# Patient Record
Sex: Male | Born: 1960 | Race: White | Hispanic: No | Marital: Married | Smoking: Never smoker
Health system: Southern US, Community
[De-identification: ages and names within clinical notes are randomized; demographics above are authoritative.]

---

## 2009-05-16 ENCOUNTER — Emergency Department (HOSPITAL_COMMUNITY): Admission: EM | Admit: 2009-05-16 | Discharge: 2009-05-16 | Payer: Self-pay | Admitting: Emergency Medicine

## 2011-03-06 ENCOUNTER — Emergency Department (HOSPITAL_COMMUNITY): Payer: PRIVATE HEALTH INSURANCE

## 2011-03-06 ENCOUNTER — Emergency Department (HOSPITAL_COMMUNITY)
Admission: EM | Admit: 2011-03-06 | Discharge: 2011-03-06 | Disposition: A | Discharge status: Home / Self Care | Payer: PRIVATE HEALTH INSURANCE | Attending: Emergency Medicine | Admitting: Emergency Medicine

## 2011-03-06 DIAGNOSIS — N200 Calculus of kidney: Secondary | ICD-10-CM | POA: Insufficient documentation

## 2011-03-06 DIAGNOSIS — R109 Unspecified abdominal pain: Secondary | ICD-10-CM | POA: Insufficient documentation

## 2011-03-06 LAB — URINE MICROSCOPIC-ADD ON

## 2011-03-06 LAB — URINALYSIS, ROUTINE W REFLEX MICROSCOPIC
Glucose, UA: NEGATIVE mg/dL
Ketones, ur: 15 mg/dL — AB
pH: 5.5 (ref 5.0–8.0)

## 2011-03-17 LAB — CULTURE, BLOOD (ROUTINE X 2)

## 2011-03-17 LAB — COMPREHENSIVE METABOLIC PANEL
Alkaline Phosphatase: 46 U/L (ref 39–117)
BUN: 13 mg/dL (ref 6–23)
Chloride: 107 mEq/L (ref 96–112)
Glucose, Bld: 125 mg/dL — ABNORMAL HIGH (ref 70–99)
Potassium: 3.7 mEq/L (ref 3.5–5.1)
Total Bilirubin: 1.1 mg/dL (ref 0.3–1.2)

## 2011-03-17 LAB — CBC
HCT: 39.8 % (ref 39.0–52.0)
Hemoglobin: 13.8 g/dL (ref 13.0–17.0)
MCHC: 34.5 g/dL (ref 30.0–36.0)
MCV: 89.9 fL (ref 78.0–100.0)
Platelets: 149 K/uL — ABNORMAL LOW (ref 150–400)
RBC: 4.43 MIL/uL (ref 4.22–5.81)
RDW: 12.7 % (ref 11.5–15.5)
WBC: 6.6 K/uL (ref 4.0–10.5)

## 2011-03-17 LAB — DIFFERENTIAL
Basophils Absolute: 0 10*3/uL (ref 0.0–0.1)
Basophils Relative: 1 % (ref 0–1)
Neutro Abs: 4.2 10*3/uL (ref 1.7–7.7)
Neutrophils Relative %: 64 % (ref 43–77)

## 2011-03-17 LAB — MAGNESIUM: Magnesium: 2.2 mg/dL (ref 1.5–2.5)

## 2011-03-17 LAB — URINALYSIS, ROUTINE W REFLEX MICROSCOPIC
Glucose, UA: NEGATIVE mg/dL
Ketones, ur: NEGATIVE mg/dL
pH: 6.5 (ref 5.0–8.0)

## 2011-03-17 LAB — CK TOTAL AND CKMB (NOT AT ARMC): CK, MB: 1.3 ng/mL (ref 0.3–4.0)

## 2012-10-31 IMAGING — CT CT ABD-PELV W/O CM
2 of 4 series · 17 of 46 positions shown, 19 images · non-contrast
Comparison: None

CLINICAL DATA: Right flank pain.

CT ABDOMEN AND PELVIS WITHOUT CONTRAST
TECHNIQUE: Multidetector CT imaging of the abdomen and pelvis was
performed following the standard protocol without intravenous
contrast.

[Series 2: a/p w/o 5.0 b31f st · axial · non-contrast · 0.65mm/px · z∈[-530,-146]mm · 14 of 85 slices shown, 16 images]
[im 4/85  soft-tissue]
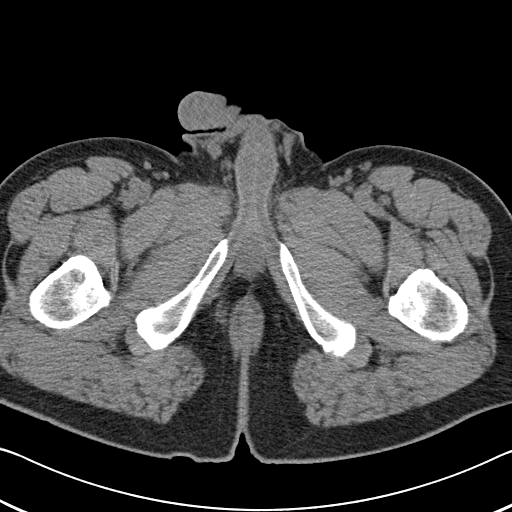
[im 4/85  bone]
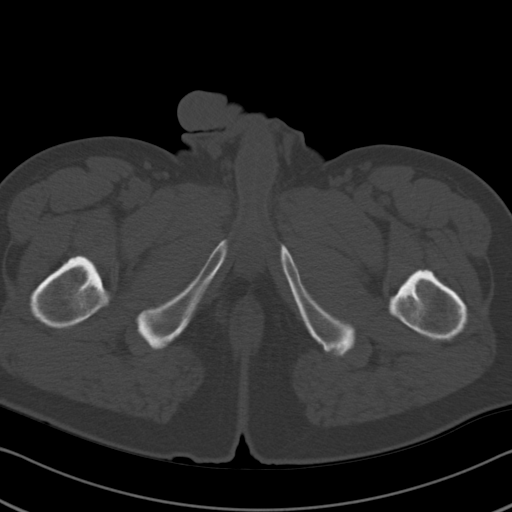
[im 10/85  soft-tissue]
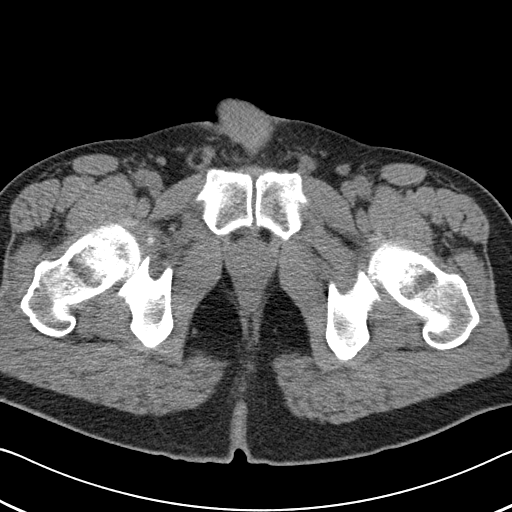
[im 17/85  soft-tissue]
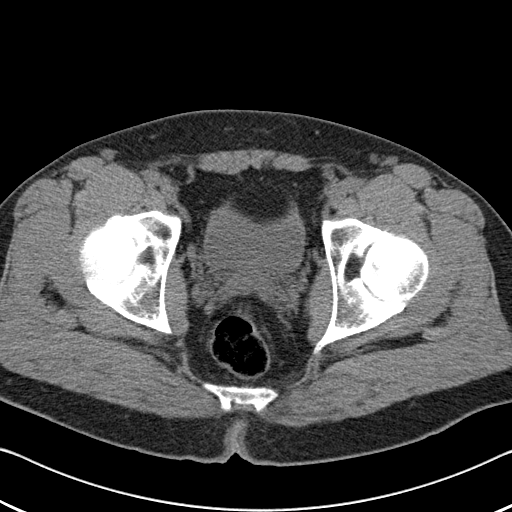
[im 23/85  soft-tissue]
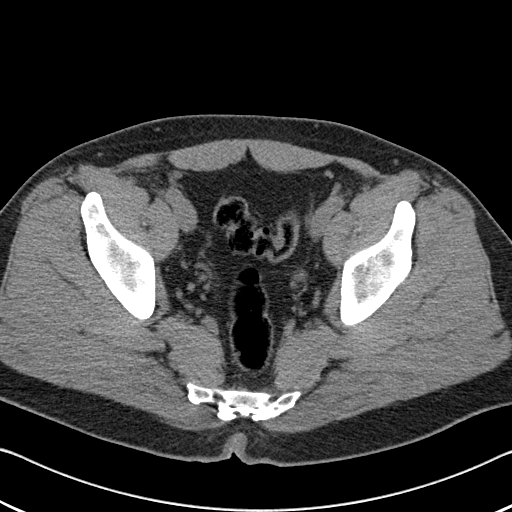
[im 30/85  soft-tissue]
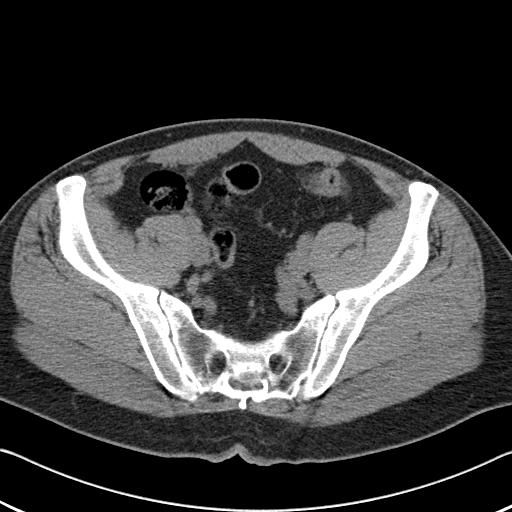
[im 33/85  soft-tissue]
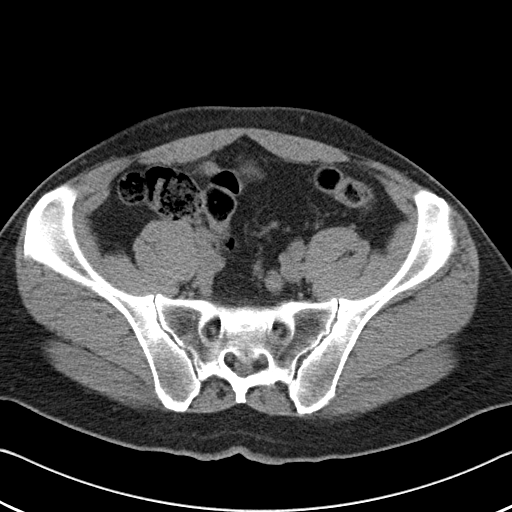
[im 39/85  soft-tissue]
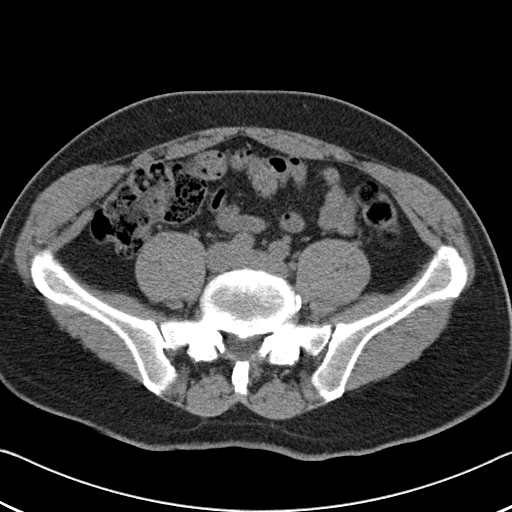
[im 46/85  soft-tissue]
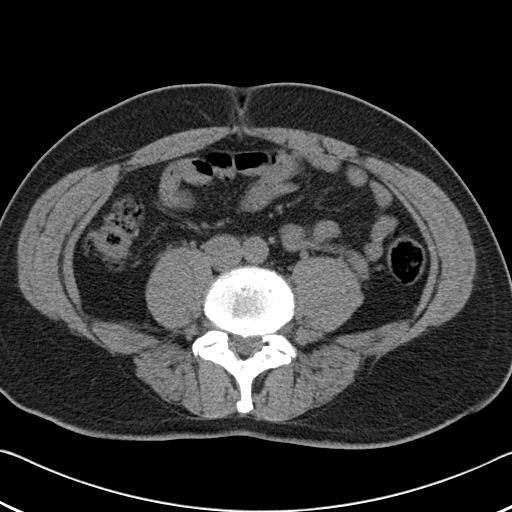
[im 52/85  soft-tissue]
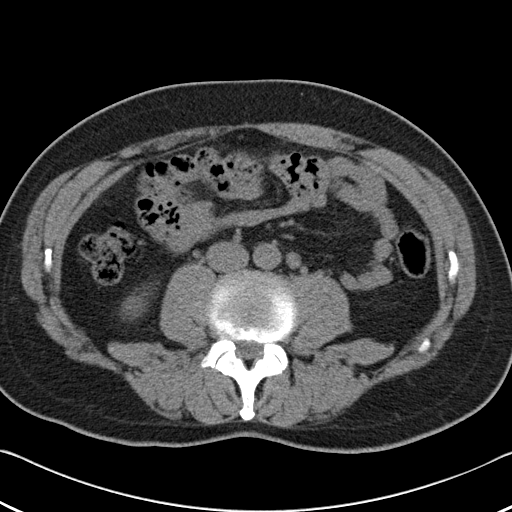
[im 52/85  bone]
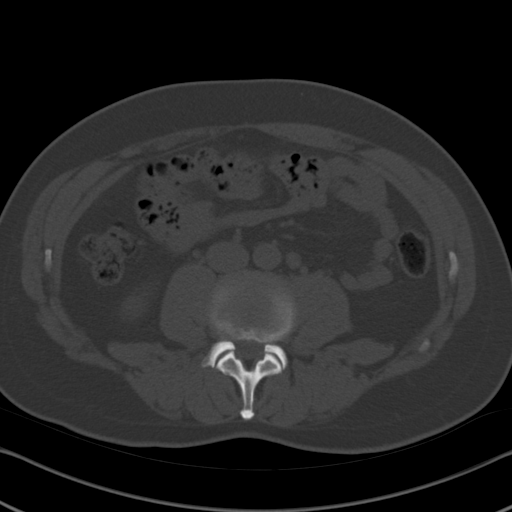
[im 55/85  soft-tissue]
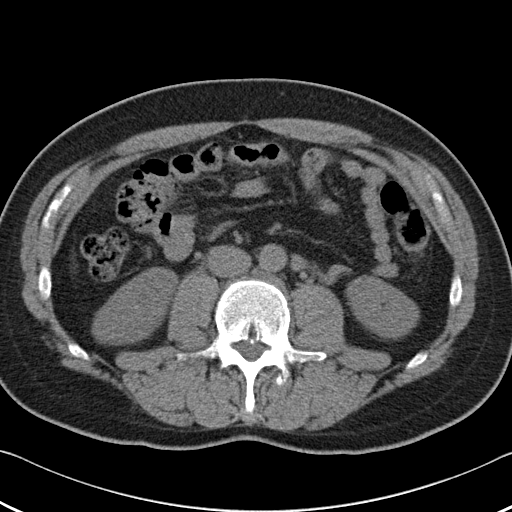
[im 62/85  soft-tissue]
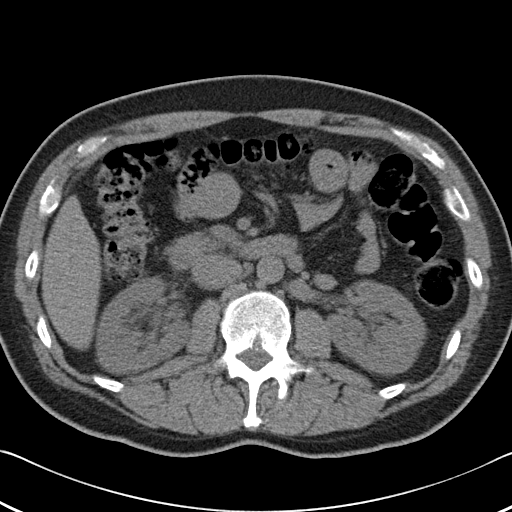
[im 68/85  soft-tissue]
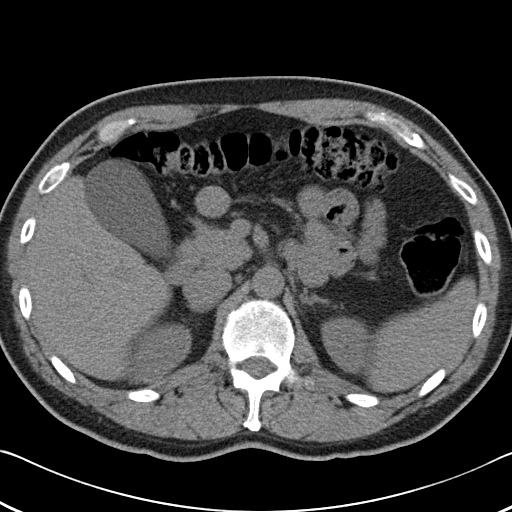
[im 75/85  soft-tissue]
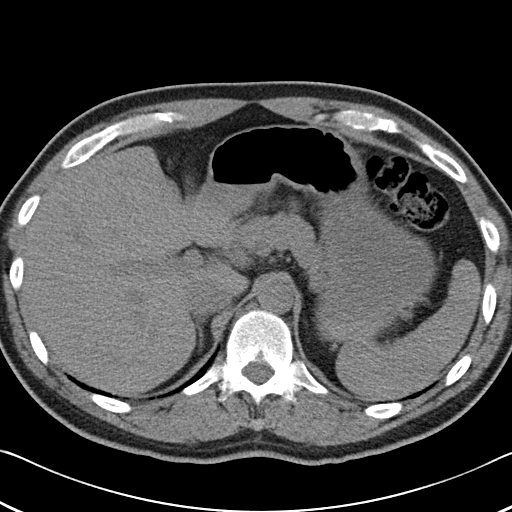
[im 81/85  soft-tissue]
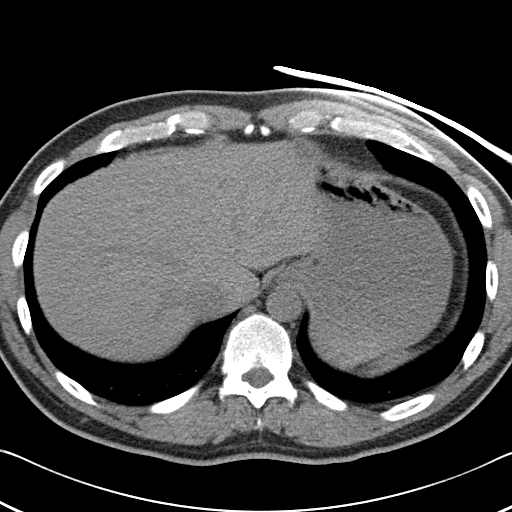

[Series 602: coronal a/p · coronal · 0.83mm/px · 3 of 77 slices shown]
[im 26/77  soft-tissue]
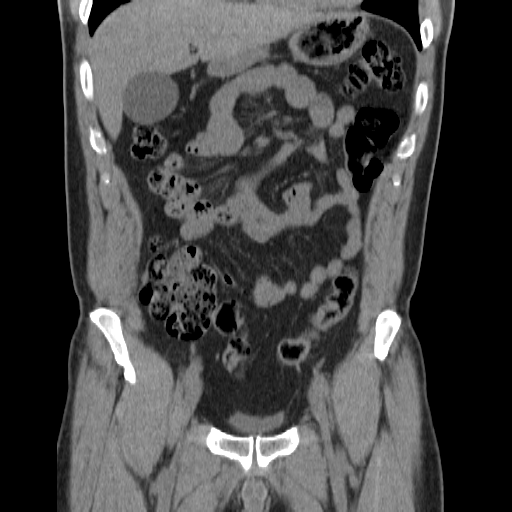
[im 34/77  soft-tissue]
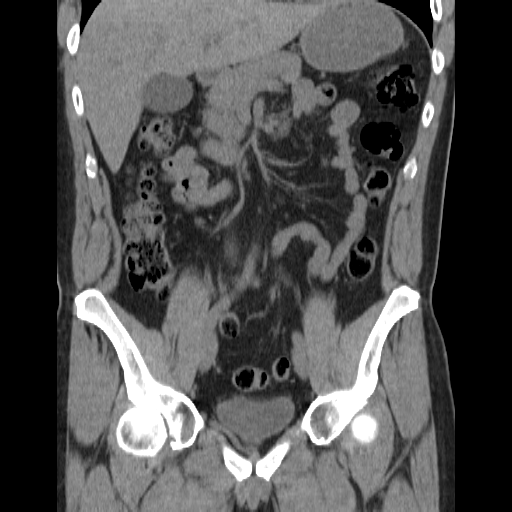
[im 43/77  soft-tissue]
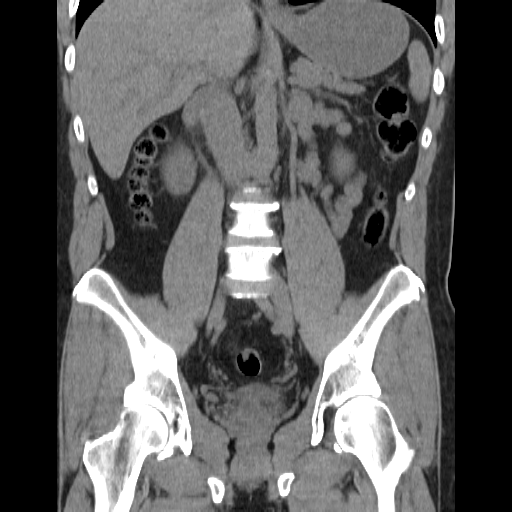

[17 of 46 positions shown; findings below may reference images not displayed]

FINDINGS: Minimal dependent atelectasis in the lung bases.  No
effusions.

There is a 4 mm stone at the right ureteral pelvic junction.  Mild
fullness of the right renal collecting system.  No additional
ureteral stones.  No stones or hydronephrosis on the left.  Bladder
is decompressed.

Liver, gallbladder, spleen, pancreas, adrenals have an unremarkable
unenhanced appearance. Bowel grossly unremarkable.  No free fluid,
free air, or adenopathy.

No acute bony abnormality.
IMPRESSION: 4 mm right UPJ stone with mild fullness of the right renal
collecting system.

Dependent bibasilar atelectasis.

## 2015-04-10 ENCOUNTER — Ambulatory Visit (INDEPENDENT_AMBULATORY_CARE_PROVIDER_SITE_OTHER): Payer: No Typology Code available for payment source | Admitting: Physician Assistant

## 2015-04-10 VITALS — BP 110/70 | HR 66 | Temp 98.1°F | Resp 20 | Wt 215.6 lb

## 2015-04-10 DIAGNOSIS — L237 Allergic contact dermatitis due to plants, except food: Secondary | ICD-10-CM

## 2015-04-10 DIAGNOSIS — L299 Pruritus, unspecified: Secondary | ICD-10-CM

## 2015-04-10 MED ORDER — HYDROXYZINE HCL 25 MG PO TABS: 12.5000 mg | ORAL_TABLET | Freq: Three times a day (TID) | ORAL | Status: AC | PRN

## 2015-04-10 MED ORDER — PREDNISONE 20 MG PO TABS: ORAL_TABLET | ORAL | Status: AC

## 2015-04-10 NOTE — Patient Instructions (Signed)

## 2015-04-10 NOTE — Progress Notes (Signed)
[facility] [address], [place] [id number] [phone]  Date:  04/10/2015   Name:  Paul Sexton   DOB:  01/19/1961   MRN:  [id number]  PCP:  No PCP Per Patient    Chief Complaint: possible poison ivy   History of Present Illness:  Paul Sexton is a 54 y.o. very pleasant male patient who presents with the following:  Patient is here today with chief complaint of rash that started on arms and face 4 days ago.  It is pruritic in character and similar to a rash he had with poison oak.  He states that he was doing yardwork the day prior before onset of rash.  There is no drainage.  It does not hurt.   He has taken benadryl which has helped him sleep through the night with less itching.  He has no fever, oral swelling, no eye discharge, or vision change.  He has had no change of medication, nor exposed to any other allergens.    There are no active problems to display for this patient.   History reviewed. No pertinent past medical history.  History reviewed. No pertinent past surgical history.  History  Substance Use Topics  . Smoking status: Never Smoker   . Smokeless tobacco: Not on file  . Alcohol Use: No    Family History  Problem Relation Age of Onset  . Hypertension Mother   . Heart disease Father   . Cancer Maternal Grandmother   . Cancer Maternal Grandfather   . Diabetes Paternal Grandmother     No Known Allergies  Medication list has been reviewed and updated.  No current outpatient prescriptions on file prior to visit.   No current facility-administered medications on file prior to visit.    Review of Systems: ROS otherwise unless otherwise mentioned.    Physical Examination: Filed Vitals:   04/10/15 0847  BP: 110/70  Pulse: 66  Temp: 98.1 F (36.7 C)  Resp: 20   Filed Vitals:   04/10/15 0847  Weight: 215 lb 9.6 oz (97.796 kg)   There is no height on file to calculate BMI. Ideal Body Weight:    Physical  Exam  Constitutional: He appears well-developed and well-nourished.  HENT:  Head: Normocephalic and atraumatic.  Mouth/Throat: No oropharyngeal exudate, posterior oropharyngeal edema or posterior oropharyngeal erythema.  Eyes: Conjunctivae are normal. Pupils are equal, round, and reactive to light.  Cardiovascular: Normal rate and regular rhythm.  Exam reveals no friction rub.   No murmur heard. Pulmonary/Chest: Effort normal and breath sounds normal. No respiratory distress. He has no wheezes.  Skin: Skin is warm and dry.  Erythematous patches of skin along the forearms bilaterally.  Scant vesicles without drainage.  There is no flaking.   Mild facial erythema at the maxillaries with milder form of erythematous patches.  Mild swelling around the eyelids.  Normal conjunctiva however, and without cobblestoning appearance of inner eyelid.   Psychiatric: He has a normal mood and affect. His speech is normal and behavior is normal.     Assessment and Plan: 54 year old male is here today with pruritic rash of the arm and face.  Due to facial involvement, I am prescribing systemic steroid, though rash is not severe.    Poison ivy - Plan: predniSONE (DELTASONE) 20 MG tablet, hydrOXYzine (ATARAX/VISTARIL) 25 MG tablet -9 day taper PO3,2,1 for three each  Pruritus - Plan: hydrOXYzine (ATARAX/VISTARIL) 25 MG tablet qhs   Judeth CornfieldStephanie  Lenox Ponds, PA-C [facility] 5/3/201612:01 PM
